# Patient Record
Sex: Female | Born: 1985 | Hispanic: Refuse to answer | Marital: Single
Health system: Southern US, Community
[De-identification: ages and names within clinical notes are randomized; demographics above are authoritative.]

---

## 2020-09-30 ENCOUNTER — Ambulatory Visit: Payer: Self-pay

## 2020-09-30 ENCOUNTER — Other Ambulatory Visit: Payer: Self-pay

## 2020-09-30 ENCOUNTER — Other Ambulatory Visit: Payer: Self-pay | Admitting: Occupational Medicine

## 2020-09-30 DIAGNOSIS — M25532 Pain in left wrist: Secondary | ICD-10-CM

## 2021-07-01 ENCOUNTER — Emergency Department (HOSPITAL_COMMUNITY): Payer: 59

## 2021-07-01 ENCOUNTER — Emergency Department (HOSPITAL_COMMUNITY)
Admission: EM | Admit: 2021-07-01 | Discharge: 2021-07-02 | Disposition: A | Discharge status: Home / Self Care | Payer: 59 | Attending: Emergency Medicine | Admitting: Emergency Medicine

## 2021-07-01 ENCOUNTER — Other Ambulatory Visit: Payer: Self-pay

## 2021-07-01 ENCOUNTER — Encounter (HOSPITAL_COMMUNITY): Payer: Self-pay | Admitting: Emergency Medicine

## 2021-07-01 DIAGNOSIS — Z3A01 Less than 8 weeks gestation of pregnancy: Secondary | ICD-10-CM | POA: Insufficient documentation

## 2021-07-01 DIAGNOSIS — O23591 Infection of other part of genital tract in pregnancy, first trimester: Secondary | ICD-10-CM | POA: Insufficient documentation

## 2021-07-01 DIAGNOSIS — B9689 Other specified bacterial agents as the cause of diseases classified elsewhere: Secondary | ICD-10-CM | POA: Diagnosis not present

## 2021-07-01 DIAGNOSIS — N9489 Other specified conditions associated with female genital organs and menstrual cycle: Secondary | ICD-10-CM | POA: Insufficient documentation

## 2021-07-01 DIAGNOSIS — O23599 Infection of other part of genital tract in pregnancy, unspecified trimester: Secondary | ICD-10-CM

## 2021-07-01 LAB — CBC WITH DIFFERENTIAL/PLATELET
Abs Immature Granulocytes: 0.01 10*3/uL (ref 0.00–0.07)
Basophils Absolute: 0 10*3/uL (ref 0.0–0.1)
Basophils Relative: 0 %
Eosinophils Absolute: 0.1 10*3/uL (ref 0.0–0.5)
Eosinophils Relative: 3 %
HCT: 32.8 % — ABNORMAL LOW (ref 36.0–46.0)
Hemoglobin: 10.3 g/dL — ABNORMAL LOW (ref 12.0–15.0)
Immature Granulocytes: 0 %
Lymphocytes Relative: 43 %
Lymphs Abs: 2 10*3/uL (ref 0.7–4.0)
MCH: 25.6 pg — ABNORMAL LOW (ref 26.0–34.0)
MCHC: 31.4 g/dL (ref 30.0–36.0)
MCV: 81.4 fL (ref 80.0–100.0)
Monocytes Absolute: 0.5 10*3/uL (ref 0.1–1.0)
Monocytes Relative: 10 %
Neutro Abs: 2.1 10*3/uL (ref 1.7–7.7)
Neutrophils Relative %: 44 %
Platelets: 347 10*3/uL (ref 150–400)
RBC: 4.03 MIL/uL (ref 3.87–5.11)
RDW: 16.7 % — ABNORMAL HIGH (ref 11.5–15.5)
WBC: 4.6 10*3/uL (ref 4.0–10.5)
nRBC: 0 % (ref 0.0–0.2)

## 2021-07-01 LAB — URINALYSIS, ROUTINE W REFLEX MICROSCOPIC
Bilirubin Urine: NEGATIVE
Glucose, UA: NEGATIVE mg/dL
Hgb urine dipstick: NEGATIVE
Ketones, ur: NEGATIVE mg/dL
Leukocytes,Ua: NEGATIVE
Nitrite: NEGATIVE
Protein, ur: NEGATIVE mg/dL
Specific Gravity, Urine: 1.011 (ref 1.005–1.030)
pH: 6 (ref 5.0–8.0)

## 2021-07-01 LAB — COMPREHENSIVE METABOLIC PANEL
ALT: 11 U/L (ref 0–44)
AST: 15 U/L (ref 15–41)
Albumin: 3.6 g/dL (ref 3.5–5.0)
Alkaline Phosphatase: 41 U/L (ref 38–126)
Anion gap: 9 (ref 5–15)
BUN: 13 mg/dL (ref 6–20)
CO2: 22 mmol/L (ref 22–32)
Calcium: 8.6 mg/dL — ABNORMAL LOW (ref 8.9–10.3)
Chloride: 106 mmol/L (ref 98–111)
Creatinine, Ser: 0.79 mg/dL (ref 0.44–1.00)
GFR, Estimated: >60 mL/min (ref >60)
Glucose, Bld: 109 mg/dL — ABNORMAL HIGH (ref 70–99)
Potassium: 3.4 mmol/L — ABNORMAL LOW (ref 3.5–5.1)
Sodium: 137 mmol/L (ref 135–145)
Total Bilirubin: 0.2 mg/dL — ABNORMAL LOW (ref 0.3–1.2)
Total Protein: 6.8 g/dL (ref 6.5–8.1)

## 2021-07-01 LAB — WET PREP, GENITAL
Sperm: NONE SEEN
Trich, Wet Prep: NONE SEEN
WBC, Wet Prep HPF POC: NONE SEEN — AB (ref <10)
Yeast Wet Prep HPF POC: NONE SEEN

## 2021-07-01 LAB — HCG, QUANTITATIVE, PREGNANCY: hCG, Beta Chain, Quant, S: 25278 m[IU]/mL — ABNORMAL HIGH (ref <5)

## 2021-07-01 LAB — LIPASE, BLOOD: Lipase: 31 U/L (ref 11–51)

## 2021-07-01 MED ORDER — METRONIDAZOLE 500 MG PO TABS
500.0000 mg | ORAL_TABLET | Freq: Once | ORAL | Status: AC
  Administered 2021-07-01: 500 mg via ORAL
  Filled 2021-07-01: qty 1

## 2021-07-01 MED ORDER — METRONIDAZOLE 500 MG PO TABS: 500.0000 mg | ORAL_TABLET | Freq: Two times a day (BID) | ORAL | 0 refills | Status: AC

## 2021-07-01 NOTE — ED Triage Notes (Signed)
Reports feeling numbness in arms and legs, states that she just recently found of she was pregnant, approximately 6 weeks. Reports a 'slight discharge' and some sharp abdominal pain intermittently. Hx of miscarriage. States she also might have a yeast infection.

## 2021-07-01 NOTE — ED Provider Notes (Signed)
[facility]-EMERGENCY DEPT Provider Note   CSN: [id number] Arrival date & time: 07/01/21  1744     History  Chief Complaint  Patient presents with   Abdominal Pain   Numbness    Jamie Hawkins is a 36 y.o. female who presents to the ED for evaluation of right lower quadrant/pelvic pain that has been ongoing for the past several days.  Patient states that she had a positive pregnancy test today with her last menstrual period starting on 05/23/2021.  She has a history of 2 full-term pregnancies delivered without complications.  However she does have a history of a miscarriage last year.  She states that she has recently been and thinks that she may have a yeast infection.  She endorses some itching but denies vaginal pain or discomfort.  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  She denies vaginal bleeding constipation, chest pain, diarrhea.   Abdominal Pain Associated symptoms: no fever, no shortness of breath and no vomiting       Home Medications Prior to Admission medications   Not on File      Allergies    Patient has no known allergies.    Review of Systems   Review of Systems  Constitutional:  Negative for fever.  HENT: Negative.    Eyes: Negative.   Respiratory:  Negative for shortness of breath.   Cardiovascular: Negative.   Gastrointestinal:  Positive for abdominal pain. Negative for vomiting.  Endocrine: Negative.   Genitourinary: Negative.   Musculoskeletal: Negative.   Skin:  Negative for rash.  Neurological:  Negative for headaches.  All other systems reviewed and are negative.  Physical Exam Updated Vital Signs BP 103/68 (BP Location: Right Arm)    Pulse 74    Temp 98.1 F (36.7 C) (Oral)    Resp 16    Ht 5\' 8"  (1.727 m)    Wt 73.9 kg    LMP 08/31/2020    SpO2 97%    BMI 24.78 kg/m  Physical Exam Vitals and nursing note  reviewed. Exam conducted with a chaperone present.  Constitutional:      General: She is not in acute distress.    Appearance: She is not ill-appearing.  HENT:     Head: Atraumatic.  Eyes:     Conjunctiva/sclera: Conjunctivae normal.  Cardiovascular:     Rate and Rhythm: Normal rate and regular rhythm.     Pulses: Normal pulses.     Heart sounds: No murmur heard. Pulmonary:     Effort: Pulmonary effort is normal. No respiratory distress.     Breath sounds: Normal breath sounds.  Abdominal:     General: Abdomen is flat. There is no distension.     Palpations: Abdomen is soft.     Tenderness: There is abdominal tenderness in the right lower quadrant and suprapubic area. There is no right CVA tenderness or left CVA tenderness. Negative signs include Murphy's sign and McBurney's sign.     Comments: Abdomen soft, nondistended tenderness to palpation of the right suprapubic/RLQ.   Genitourinary:    Cervix: Discharge present. No cervical motion tenderness.     Comments: Negative cervical motion tenderness.  Vaginal canal and cervix without erythema or lesions.  There was some frothy white discharge. Musculoskeletal:        General: Normal range of motion.     Cervical back: Normal range of  motion.  Skin:    General: Skin is warm and dry.     Capillary Refill: Capillary refill takes less than 2 seconds.  Neurological:     General: No focal deficit present.     Mental Status: She is alert.     Comments: Speech is clear, able to follow commands  Strong and equal grip strength Sensation normal to light and sharp touch in UE and LE bilaterally Moves extremities without ataxia, coordination intact Normal finger to nose and rapid alternating movements No pronator drift    Psychiatric:        Mood and Affect: Mood normal.    ED Results / Procedures / Treatments   Labs (all labs ordered are listed, but only abnormal results are displayed) Labs Reviewed  CBC WITH DIFFERENTIAL/PLATELET  - Abnormal; Notable for the following components:      Result Value   Hemoglobin 10.3 (*)    HCT 32.8 (*)    MCH 25.6 (*)    RDW 16.7 (*)    All other components within normal limits  WET PREP, GENITAL  HCG, QUANTITATIVE, PREGNANCY  COMPREHENSIVE METABOLIC PANEL  URINALYSIS, ROUTINE W REFLEX MICROSCOPIC  LIPASE, BLOOD  GC/CHLAMYDIA PROBE AMP ([place]) NOT AT [facility]    EKG None  Radiology No results found.  Procedures Procedures    Medications Ordered in ED Medications - No data to display  ED Course/ Medical Decision Making/ A&P                           Medical Decision Making Amount and/or Complexity of Data Reviewed Labs: ordered. Radiology: ordered.  Risk Prescription drug management.   History:  Per HPI  Initial impression:  This patient presents to the ED for concern of lower abdominal pain in the setting of pregnancy, this involves an extensive number of treatment options, and is a complaint that carries with it a high risk of complications and morbidity.   Differentials include vaginal infection, miscarriage, PID, constipation, gallbladder pathology  ED Course: Patient is well-appearing, no acute distress, nontoxic-appearing.  Reports positive pregnancy test at home and suspects she has a yeast infection.  Abdominal exam significant for right lower quadrant and right pelvic pain tenderness to palpation.  Abdomen is otherwise soft, nondistended.  Neuro exam was unremarkable.  Pelvic exam without cervical motion tenderness.  There was some frothy white discharge.  Os was closed without evidence of miscarriage or fetal product. Wet prep was positive for bacterial vaginosis.  hCG quant at over 25,000.  CMP and CBC without any abnormalities.  UA without infection.  OB ultrasound identifies an intrauterine pregnancy at approximately 6 weeks and 2 days.  He does have a slow heart rate, but this is likely due to its early gestation.  Overall patient's work-up is  reassuring.  No evidence of ovarian torsion, or ectopic pregnancy.  She is not currently having any vaginal bleeding.  No urinary tract infection.  She can be discharged home with strict return precautions and outpatient follow-up  Lab Tests and EKG:  I Ordered, reviewed, and interpreted labs, including CBC, UA, wet prep, GC chlamydia, pregnancy test, lipase and CMP and EKG.  The pertinent results are in the ED course above   Imaging Studies ordered:  I ordered imaging studies including an OB ultrasound for less than 14 weeks I independently visualized and interpreted imaging and I agree with the radiologist interpretation.    Cardiac Monitoring:  The  patient was maintained on a cardiac monitor.  I personally viewed and interpreted the cardiac monitored which showed an underlying rhythm of: NSR   Medicines ordered and prescription drug management:  I ordered medication including: Metronidazole 500 mg for BV Reevaluation of the patient after these medicines showed that the patient stayed the same I have reviewed the patients home medicines and have made adjustments as needed  Disposition:  After consideration of the diagnostic results, physical exam, history and the patients response to treatment feel that the patent would benefit from discharge with strict return precautions.   Bacterial vaginosis in pregnancy: Emergent concerns for patient's chief complaint have been ruled out, and the only abnormality we can find a physical exam and work-up was for bacterial vaginosis.  Up-to-date confirms that metronidazole p.o. is safe to given first trimester pregnancy.  I also called pharmacy and spoke with pharmacist Fayrene Fearing who also confirms that it is safe to give during first trimester pregnancy.  Her first dose was given here in the ED and she is able to fill the remaining prescription tomorrow.  Have also given a referral to an OB/GYN if she does not have previously established care.  Return  precautions were discussed.  All questions asked and answered.  She was discharged home in good condition.   Final Clinical Impression(s) / ED Diagnoses Final diagnoses:  None    Rx / DC Orders ED Discharge Orders     None         Delight Ovens 07/01/21 2350    Linwood Dibbles, MD 07/04/21 [phone]

## 2021-07-01 NOTE — Discharge Instructions (Addendum)
Your work-up today is overall reassuring.  Your ultrasound dates the fetus at approximately 6 weeks and 41 days old.  They did note that the heart rate was slightly low, however it is so early on in its gestation that this is the likely cause.  Recommend that you follow-up with your OB/GYN for repeat ultrasound in 14 days to ensure proper progression.  You also were found to have bacterial vaginosis.  Given your first dose of metronidazole here in the emergency department, and you can fill the remaining prescription tomorrow to take twice daily for 7 days.  Return to the ED if you have worsening pain, vaginal bleeding.  Otherwise follow-up with your OB/GYN.

## 2021-07-02 LAB — GC/CHLAMYDIA PROBE AMP (~~LOC~~) NOT AT ARMC
Chlamydia: NEGATIVE
Comment: NEGATIVE
Comment: NORMAL
Neisseria Gonorrhea: NEGATIVE

## 2021-07-24 ENCOUNTER — Emergency Department (HOSPITAL_COMMUNITY)
Admission: EM | Admit: 2021-07-24 | Discharge: 2021-07-24 | Disposition: A | Discharge status: Home / Self Care | Payer: 59 | Attending: Emergency Medicine | Admitting: Emergency Medicine

## 2021-07-24 ENCOUNTER — Encounter (HOSPITAL_COMMUNITY): Payer: Self-pay

## 2021-07-24 ENCOUNTER — Emergency Department (HOSPITAL_COMMUNITY): Payer: 59

## 2021-07-24 DIAGNOSIS — Z3A01 Less than 8 weeks gestation of pregnancy: Secondary | ICD-10-CM | POA: Insufficient documentation

## 2021-07-24 DIAGNOSIS — R42 Dizziness and giddiness: Secondary | ICD-10-CM | POA: Insufficient documentation

## 2021-07-24 DIAGNOSIS — O208 Other hemorrhage in early pregnancy: Secondary | ICD-10-CM | POA: Insufficient documentation

## 2021-07-24 DIAGNOSIS — O26891 Other specified pregnancy related conditions, first trimester: Secondary | ICD-10-CM | POA: Diagnosis not present

## 2021-07-24 DIAGNOSIS — R109 Unspecified abdominal pain: Secondary | ICD-10-CM | POA: Insufficient documentation

## 2021-07-24 DIAGNOSIS — N939 Abnormal uterine and vaginal bleeding, unspecified: Secondary | ICD-10-CM

## 2021-07-24 DIAGNOSIS — N9489 Other specified conditions associated with female genital organs and menstrual cycle: Secondary | ICD-10-CM | POA: Diagnosis not present

## 2021-07-24 DIAGNOSIS — O034 Incomplete spontaneous abortion without complication: Secondary | ICD-10-CM

## 2021-07-24 LAB — COMPREHENSIVE METABOLIC PANEL
ALT: 10 U/L (ref 0–44)
AST: 14 U/L — ABNORMAL LOW (ref 15–41)
Albumin: 3.8 g/dL (ref 3.5–5.0)
Alkaline Phosphatase: 41 U/L (ref 38–126)
Anion gap: 7 (ref 5–15)
BUN: 16 mg/dL (ref 6–20)
CO2: 24 mmol/L (ref 22–32)
Calcium: 8.3 mg/dL — ABNORMAL LOW (ref 8.9–10.3)
Chloride: 106 mmol/L (ref 98–111)
Creatinine, Ser: 0.87 mg/dL (ref 0.44–1.00)
GFR, Estimated: >60 mL/min (ref >60)
Glucose, Bld: 98 mg/dL (ref 70–99)
Potassium: 3.9 mmol/L (ref 3.5–5.1)
Sodium: 137 mmol/L (ref 135–145)
Total Bilirubin: 0.2 mg/dL — ABNORMAL LOW (ref 0.3–1.2)
Total Protein: 7.1 g/dL (ref 6.5–8.1)

## 2021-07-24 LAB — CBC WITH DIFFERENTIAL/PLATELET
Abs Immature Granulocytes: 0.01 10*3/uL (ref 0.00–0.07)
Basophils Absolute: 0 10*3/uL (ref 0.0–0.1)
Basophils Relative: 0 %
Eosinophils Absolute: 0.1 10*3/uL (ref 0.0–0.5)
Eosinophils Relative: 2 %
HCT: 31.8 % — ABNORMAL LOW (ref 36.0–46.0)
Hemoglobin: 9.7 g/dL — ABNORMAL LOW (ref 12.0–15.0)
Immature Granulocytes: 0 %
Lymphocytes Relative: 42 %
Lymphs Abs: 2 10*3/uL (ref 0.7–4.0)
MCH: 25 pg — ABNORMAL LOW (ref 26.0–34.0)
MCHC: 30.5 g/dL (ref 30.0–36.0)
MCV: 82 fL (ref 80.0–100.0)
Monocytes Absolute: 0.4 10*3/uL (ref 0.1–1.0)
Monocytes Relative: 8 %
Neutro Abs: 2.3 10*3/uL (ref 1.7–7.7)
Neutrophils Relative %: 48 %
Platelets: 406 10*3/uL — ABNORMAL HIGH (ref 150–400)
RBC: 3.88 MIL/uL (ref 3.87–5.11)
RDW: 15.9 % — ABNORMAL HIGH (ref 11.5–15.5)
WBC: 4.8 10*3/uL (ref 4.0–10.5)
nRBC: 0 % (ref 0.0–0.2)

## 2021-07-24 LAB — HCG, QUANTITATIVE, PREGNANCY: hCG, Beta Chain, Quant, S: 550 m[IU]/mL — ABNORMAL HIGH (ref <5)

## 2021-07-24 LAB — ABO/RH: ABO/RH(D): A POS

## 2021-07-24 NOTE — ED Notes (Signed)
Pt in US at this time 

## 2021-07-24 NOTE — ED Provider Notes (Signed)
?[facility]-EMERGENCY DEPT ?Provider Note ? ? ?CSN: [id number] ?Arrival date & time: 07/24/21  1033 ? ?  ? ?History ? ?Chief Complaint  ?Patient presents with  ? Vaginal Bleeding  ? ? ?Jamie Hawkins is a 36 y.o. female. ? ?36 year old female, 2 months pregnant, LMP 05/23/21, 2 weeks of heavy bleeding and cramping. Bleeding had tapered off but came back worse today which prompted ER visit today. Complains of feeling dizzy, concern for complications of miscarriage.  ?2 prior c-sections (2006, 2012), miscarriage 2022.  Reports using 1 pad prior to arrival in the ER today. ? ? ?  ? ?Home Medications ?Prior to Admission medications   ?Not on File  ?   ? ?Allergies    ?Patient has no known allergies.   ? ?Review of Systems   ?Review of Systems ?Negative except as per HPI ?Physical Exam ?Updated Vital Signs ?BP 104/73   Pulse 65   Resp 10   LMP 08/31/2020   SpO2 97%  ?Physical Exam ?Vitals and nursing note reviewed. Exam conducted with a chaperone present.  ?Constitutional:   ?   General: She is not in acute distress. ?   Appearance: She is well-developed. She is not diaphoretic.  ?HENT:  ?   Head: Normocephalic and atraumatic.  ?Cardiovascular:  ?   Rate and Rhythm: Normal rate and regular rhythm.  ?   Heart sounds: Normal heart sounds.  ?Pulmonary:  ?   Effort: Pulmonary effort is normal.  ?   Breath sounds: Normal breath sounds.  ?Abdominal:  ?   Palpations: Abdomen is soft.  ?   Tenderness: There is no abdominal tenderness.  ?Genitourinary: ?   Comments: Clot in the vaginal vault, blood cleared with swabs, no further bleeding noted ?Skin: ?   General: Skin is warm and dry.  ?   Findings: No erythema or rash.  ?Neurological:  ?   Mental Status: She is alert and oriented to person, place, and time.  ?   Motor: No weakness.  ?Psychiatric:     ?   Behavior: Behavior normal.  ? ? ?ED Results / Procedures / Treatments   ?Labs ?(all labs ordered are listed, but only abnormal results are displayed) ?Labs  Reviewed  ?COMPREHENSIVE METABOLIC PANEL - Abnormal; Notable for the following components:  ?    Result Value  ? Calcium 8.3 (*)   ? AST 14 (*)   ? Total Bilirubin 0.2 (*)   ? All other components within normal limits  ?CBC WITH DIFFERENTIAL/PLATELET - Abnormal; Notable for the following components:  ? Hemoglobin 9.7 (*)   ? HCT 31.8 (*)   ? MCH 25.0 (*)   ? RDW 15.9 (*)   ? Platelets 406 (*)   ? All other components within normal limits  ?HCG, QUANTITATIVE, PREGNANCY - Abnormal; Notable for the following components:  ? hCG, Beta Chain, Quant, S 550 (*)   ? All other components within normal limits  ?ABO/RH  ? ? ?EKG ?None ? ?Radiology ?US OB Transvaginal ? ?Result Date: 07/24/2021 ?CLINICAL DATA:  Vaginal bleeding EXAM: OBSTETRIC <14 WK ULTRASOUND TECHNIQUE: Transabdominal ultrasound was performed for evaluation of the gestation as well as the maternal uterus and adnexal regions. COMPARISON:  None. FINDINGS: Intrauterine gestational sac: None Yolk sac:  Not seen Embryo:  Not seen Cardiac Activity: Not seen Heart Rate: Not Seen bpm Subchorionic hemorrhage:  None visualized. Maternal uterus/adnexae: Endometrial stripe is prominent measuring 1.6 cm. There is inhomogeneous echogenicity in the  endometrial cavity in the uterus and cervix. There is no abnormal increased vascularity in the endometrium. There are no dominant adnexal masses. There is vascular flow in both adnexal regions. There is no free fluid in the pelvis. IMPRESSION: There is no demonstrable intrauterine gestational sac. Endometrial stripe is prominent with inhomogeneous echogenicity suggesting possible blood products. There is no abnormal increased vascularity in the endometrium. Electronically Signed   By: Ernie Avena M.D.   On: 07/24/2021 12:28   ? ?Procedures ?Procedures  ? ? ?Medications Ordered in ED ?Medications - No data to display ? ?ED Course/ Medical Decision Making/ A&P ?  ?                        ?Medical Decision Making ? ?36 year old  female presents with concern for miscarriage.  Review of records, patient was last seen emergency room on 07/01/2021, ultrasound showing 6-week 2-day gestation with fetal bradycardia.  Patient states that she started bleeding 2 weeks ago, bleeding was heavy initially and then tapered off.  Bleeding resumed today, heavy in nature, with clots, had bled through 1 pad which prompted her to come to the emergency room. ?Patient evaluated in the emergency room, her vitals are stable.  Her hemoglobin is without significant change from prior, previously 10.3, today 9.7.  Recommend prenatal vitamin with iron. ?Case discussed with Sam, [facility] APP, [facility], given address and information for patient to contact clinic.  Also discussed follow-up with the emergency room for worsening or concerning symptoms or present to the women's entrance at [facility]. ?Ultrasound today with thickened endometrial stripe otherwise no evidence of fetal sac present at this time. ?Patient is a positive, quant today is 550, significantly decreased from prior of 25,278. ? ? ? ? ? ? ? ?Final Clinical Impression(s) / ED Diagnoses ?Final diagnoses:  ?Incomplete miscarriage  ? ? ?Rx / DC Orders ?ED Discharge Orders   ? ? None  ? ?  ? ? ?  ?Jeannie Fend, PA-C ?07/24/21 1451 ? ?  ?Lorre Nick, MD ?07/25/21 1234 ? ?

## 2021-07-24 NOTE — Discharge Instructions (Addendum)
Follow up with GYN listed below: ?[facility]  ?[address]  ?[phone]  ? ?Recommend prenatal/multivitamin with iron.  ? ?Return to the ER or you can go to the [facility] entrance (labor and delivery) at [facility] for any worsening or concerning symptoms.  ?

## 2021-07-24 NOTE — ED Triage Notes (Signed)
Pt arrived via POV, c/o heavy vaginal bleeding x3 weeks. States she was approx 2 months.  ?

## 2022-05-28 IMAGING — DX DG WRIST COMPLETE 3+V*L*
4 series · 4 of 4 positions shown · non-contrast
Comparison: None.

CLINICAL DATA: Pain following trauma

EXAM:
LEFT WRIST - COMPLETE 3+ VIEW

[wrist pa]
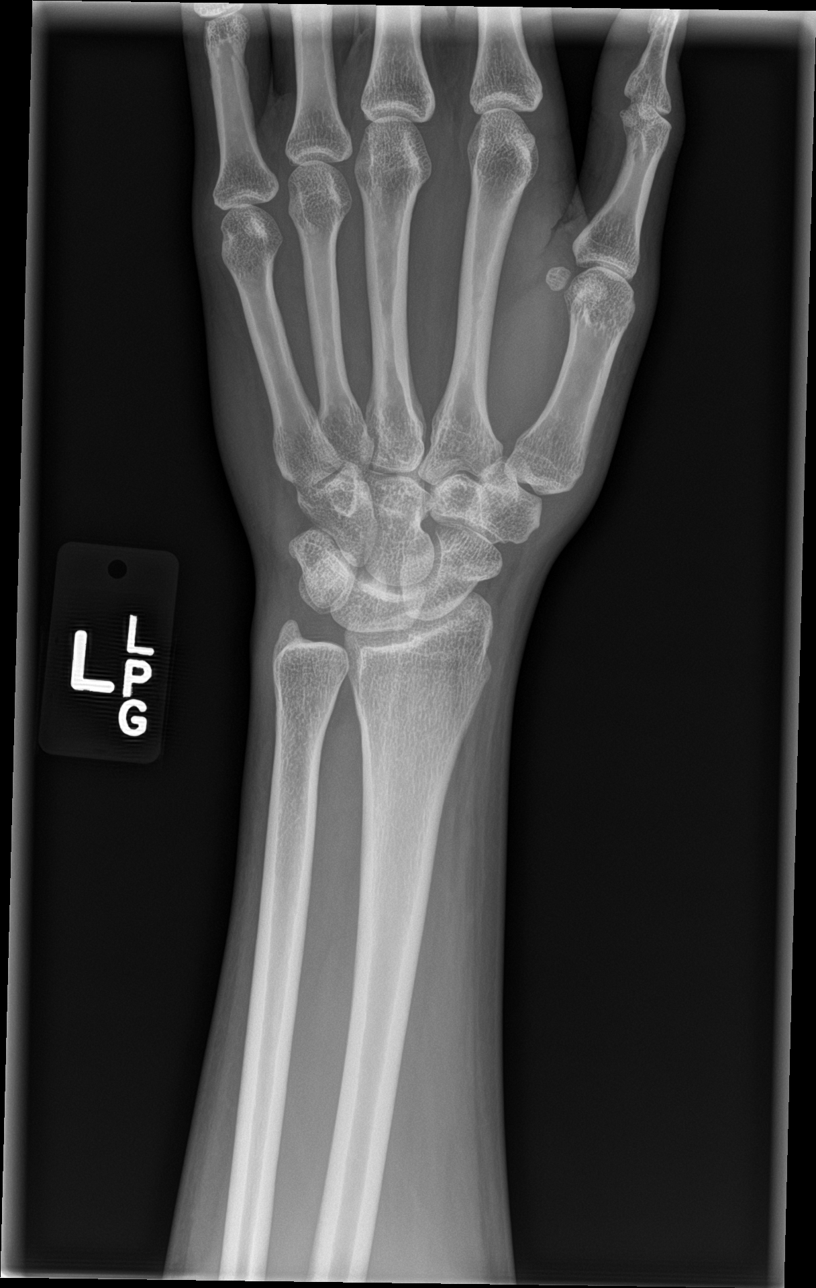

[wrist obl]
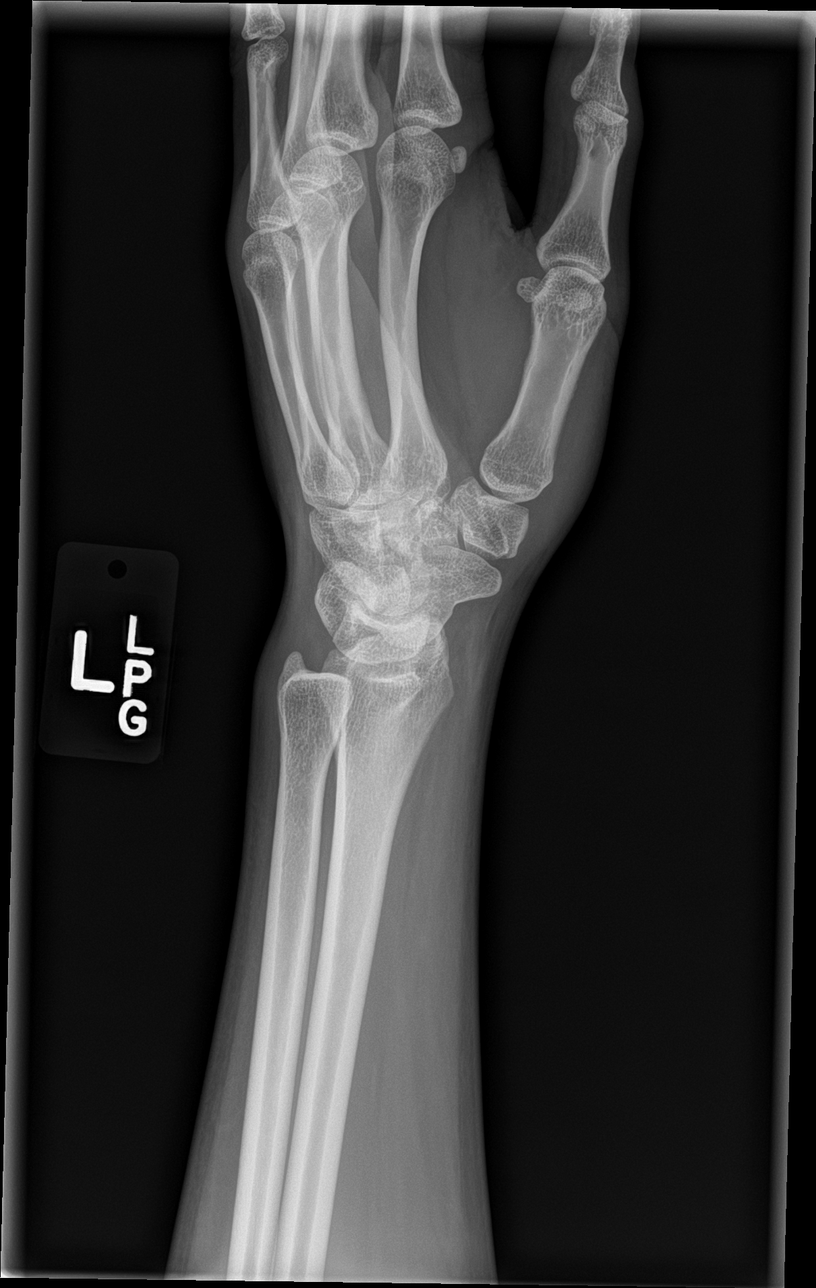

[wrist lat]
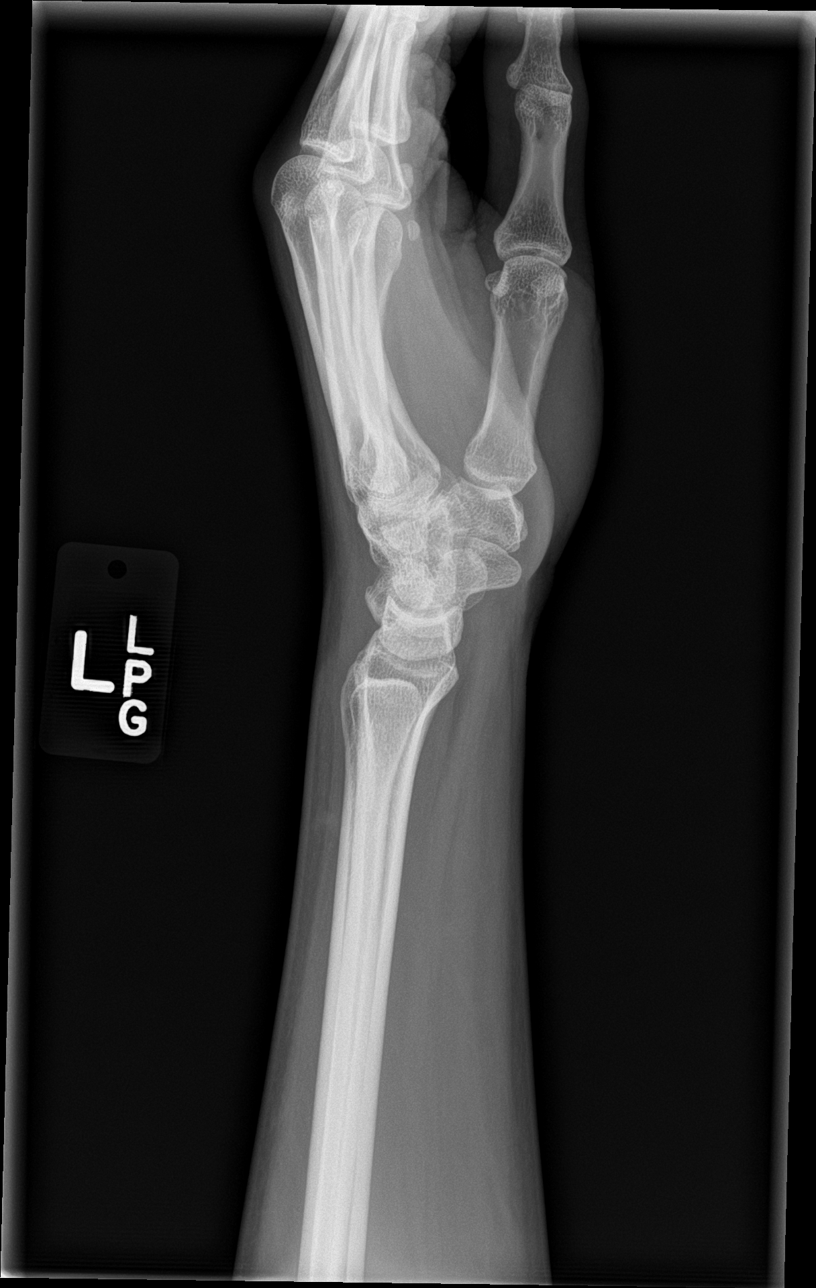

[wrist navicular]
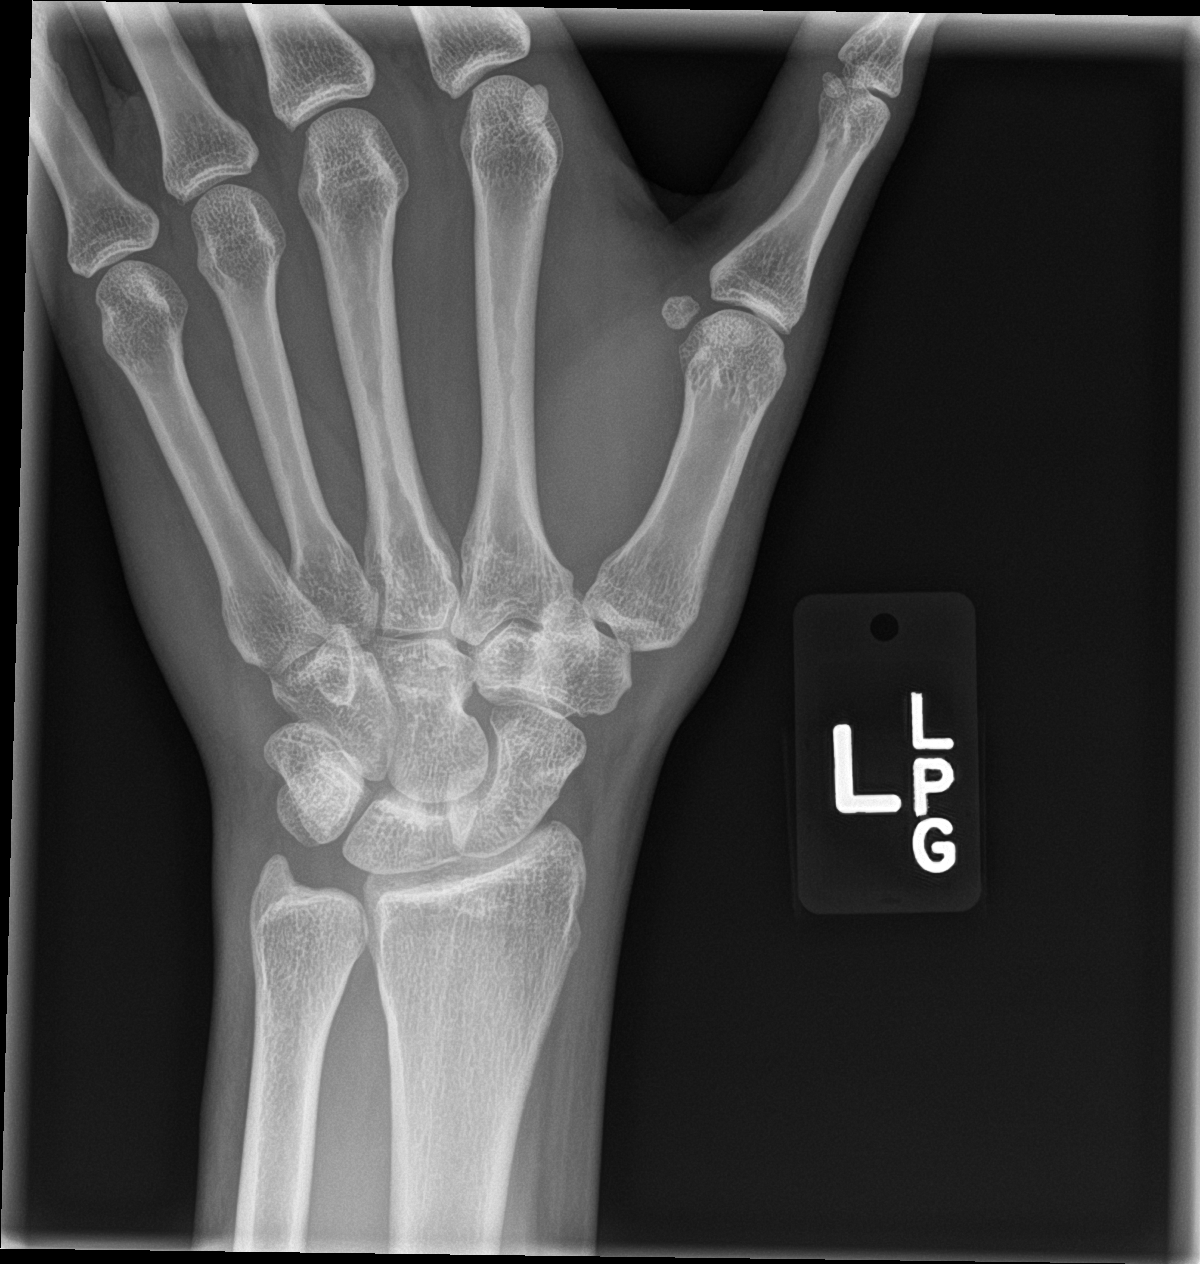

[4 of 4 positions shown; findings below may reference images not displayed]

FINDINGS: Frontal, oblique, lateral, and ulnar deviation scaphoid images were
obtained. No fracture or dislocation. Joint spaces appear normal. No
erosive change.
IMPRESSION: No fracture or dislocation.  No evident arthropathy.

## 2023-02-26 IMAGING — US US OB TRANSVAGINAL
1 series · 15 of 28 positions shown · non-contrast
Comparison: None.

CLINICAL DATA: Right pelvic pain

EXAM:
OBSTETRIC <14 WK US AND TRANSVAGINAL OB US
TECHNIQUE: Both transabdominal and transvaginal ultrasound examinations were
performed for complete evaluation of the gestation as well as the
maternal uterus, adnexal regions, and pelvic cul-de-sac.
Transvaginal technique was performed to assess early pregnancy.

[Series 1: us ob comp less 14 wks mc & wl · 15 of 96 slices shown]
[im 1/96]
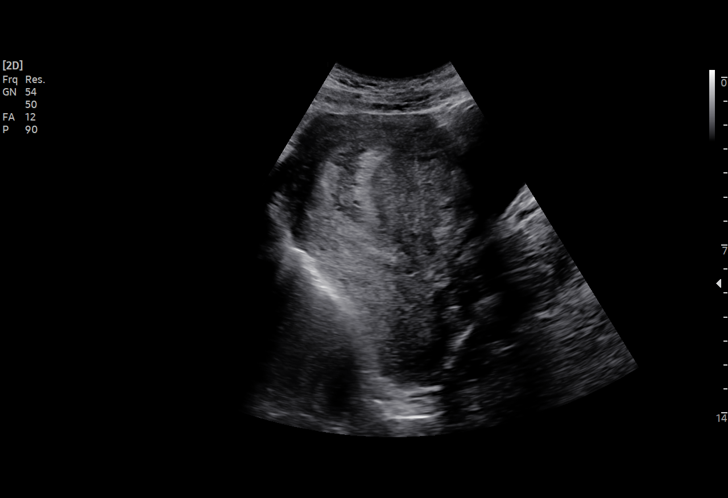
[im 8/96]
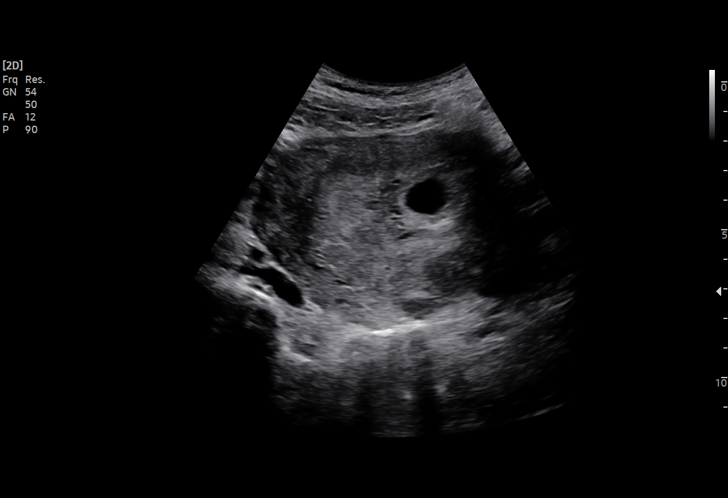
[im 15/96]
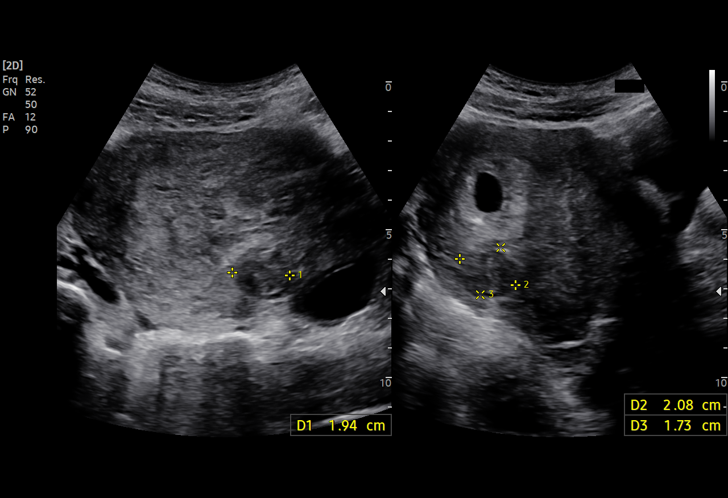
[im 22/96]
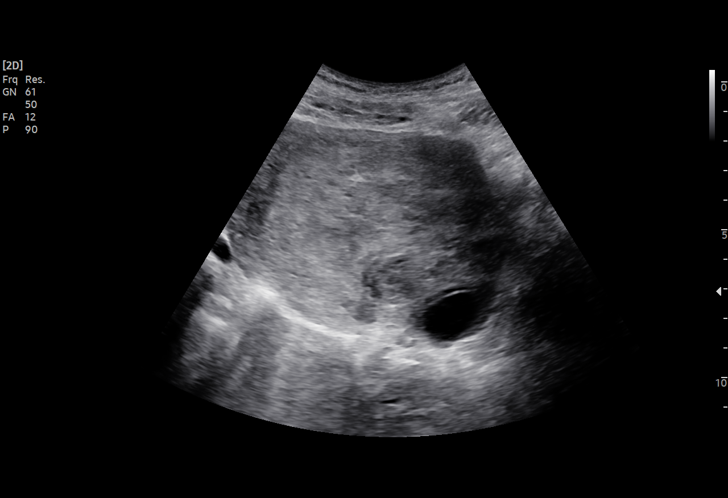
[im 29/96]
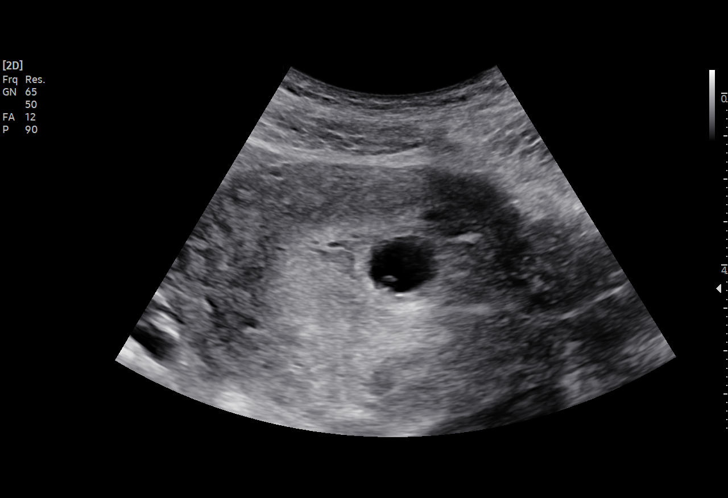
[im 36/96]
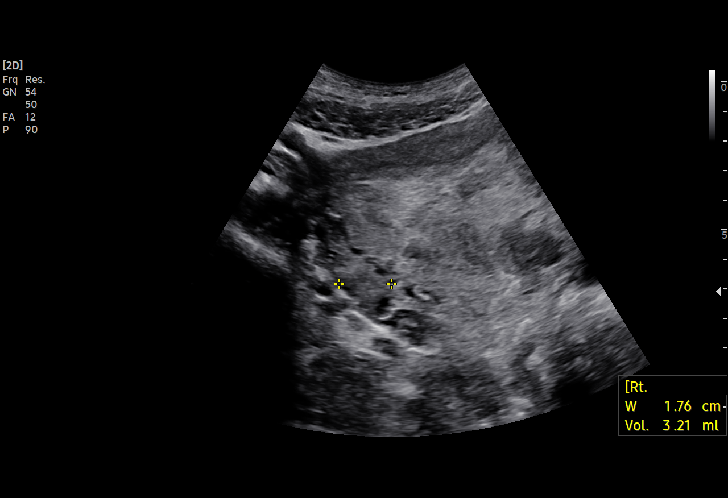
[im 43/96]
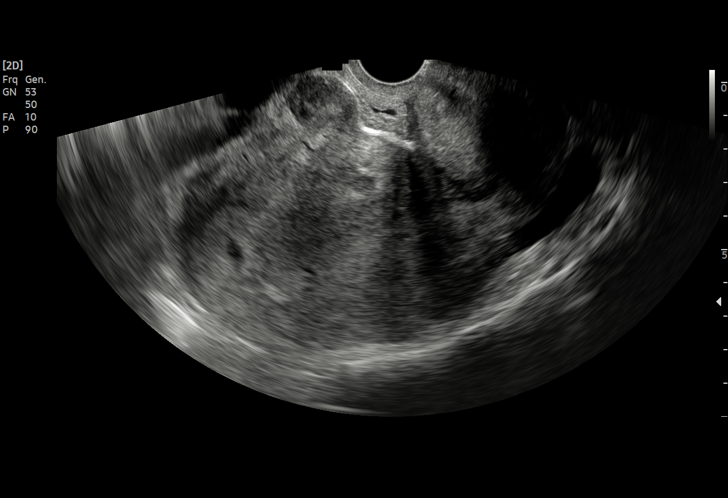
[im 50/96]
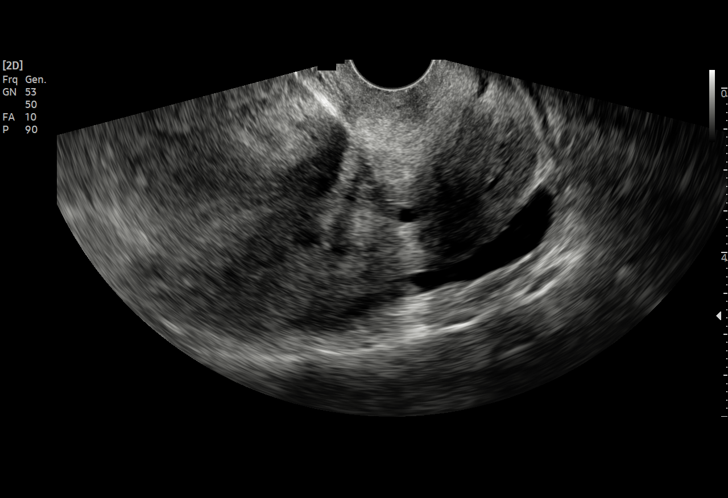
[im 53/96]
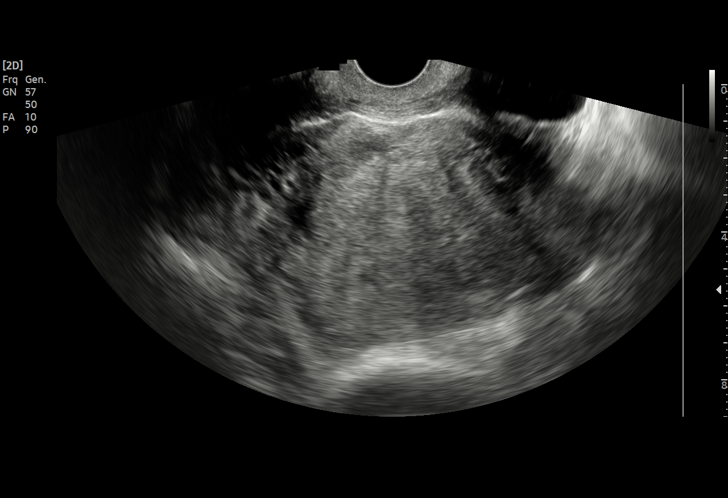
[im 60/96]
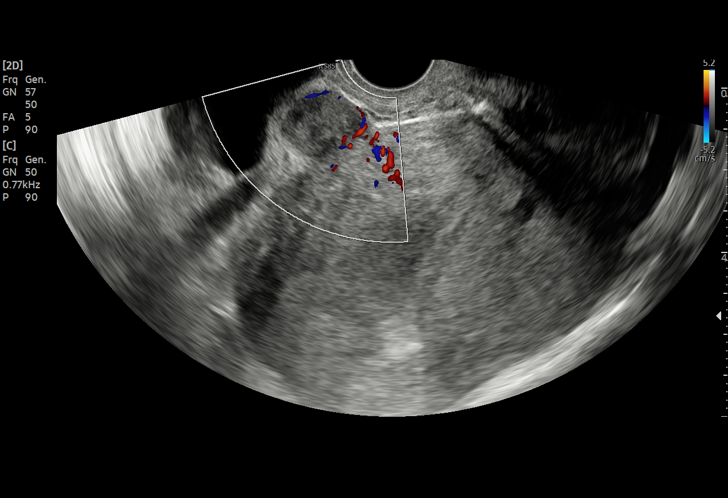
[im 67/96]
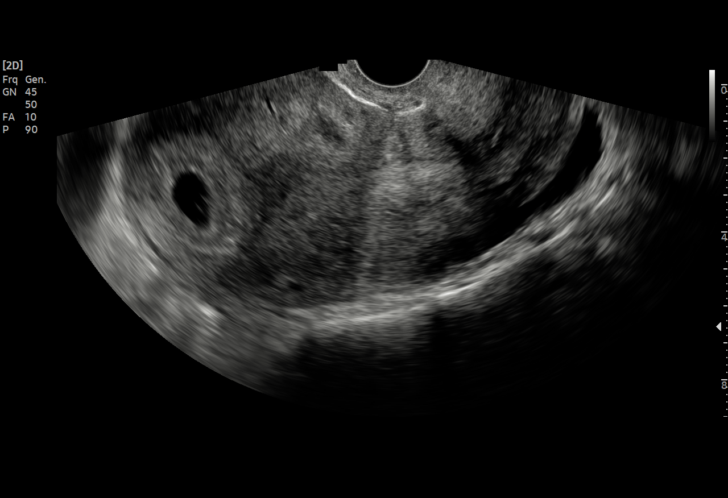
[im 74/96]
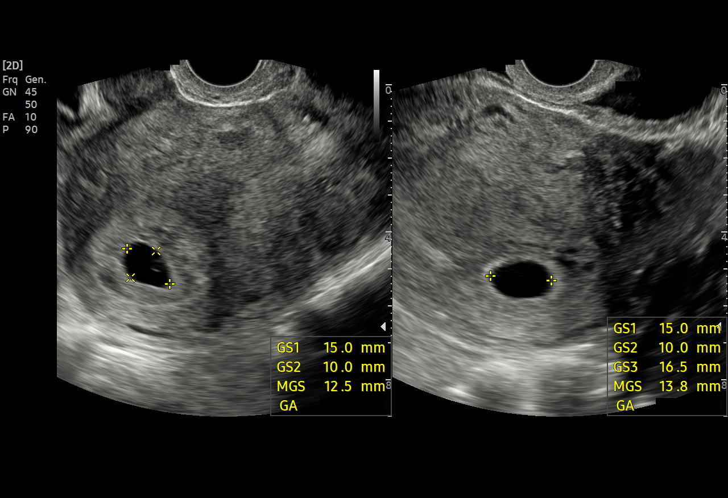
[im 81/96]
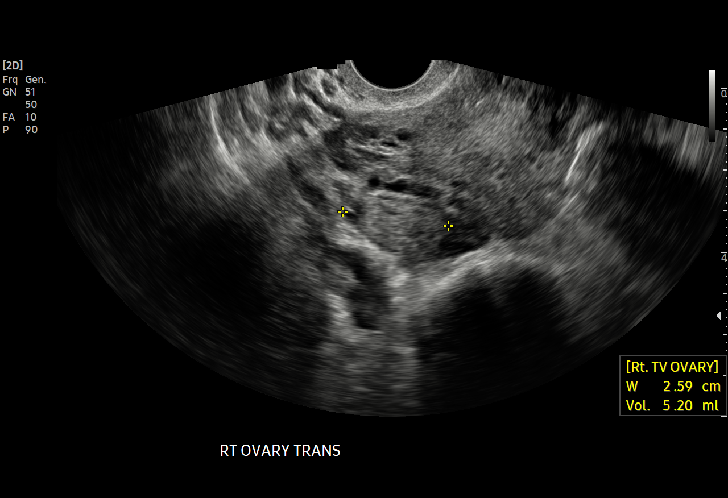
[im 88/96]
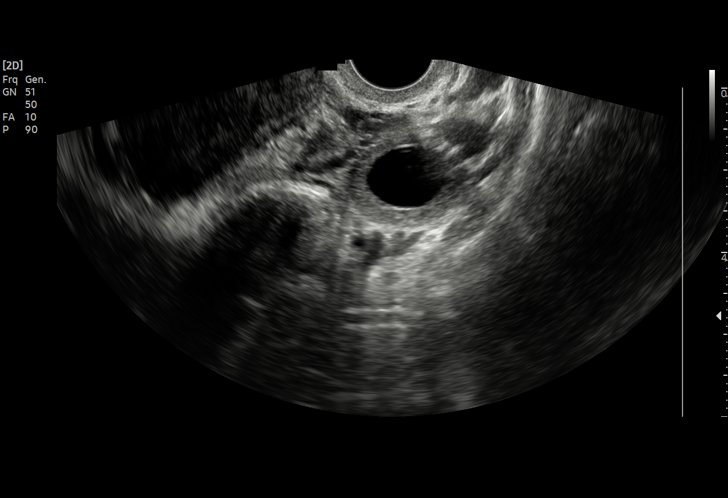
[im 96/96]
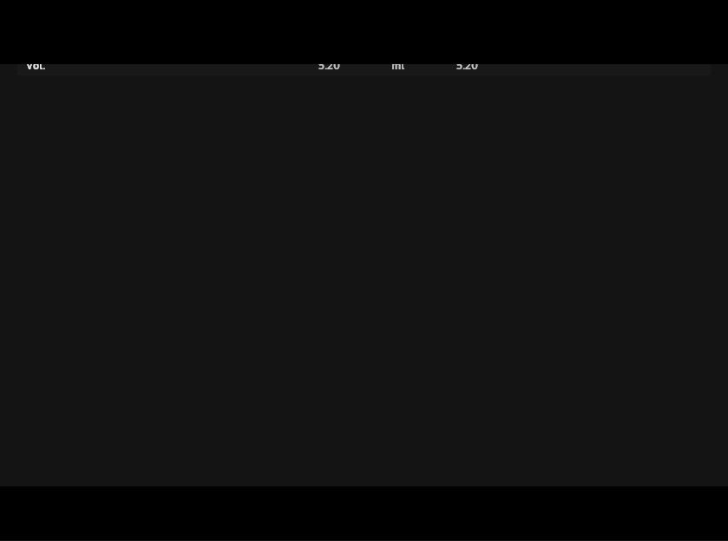

[15 of 28 positions shown; findings below may reference images not displayed]

FINDINGS: Intrauterine gestational sac: Single

Yolk sac:  Visualized.

Embryo:  Visualized.

Cardiac Activity: Visualized.

Heart Rate: 88 bpm

MSD: 13.8 mm   6 w   2 d

CRL:  1.6 mm   too early to date

Subchorionic hemorrhage:  Small subchorionic hemorrhage.

Maternal uterus/adnexae: Multiple uterine fibroids. No adnexal mass.
Trace free fluid.
IMPRESSION: Early intrauterine pregnancy, 6 weeks 2 days by mean sac diameter.
Early fetal pole is visualized, too early to date. Fetal bradycardia
of 88 beats per minute, likely related to early gestational age.
This could be followed with repeat ultrasound in 14 days to ensure
expected progression.

Small subchorionic hemorrhage.

## 2023-02-26 IMAGING — US US OB COMP LESS 14 WK
1 series · 15 of 28 positions shown · non-contrast
Comparison: None.

CLINICAL DATA: Right pelvic pain

EXAM:
OBSTETRIC <14 WK US AND TRANSVAGINAL OB US
TECHNIQUE: Both transabdominal and transvaginal ultrasound examinations were
performed for complete evaluation of the gestation as well as the
maternal uterus, adnexal regions, and pelvic cul-de-sac.
Transvaginal technique was performed to assess early pregnancy.

[Series 1: us ob comp less 14 wks mc & wl · 15 of 96 slices shown]
[im 1/96]
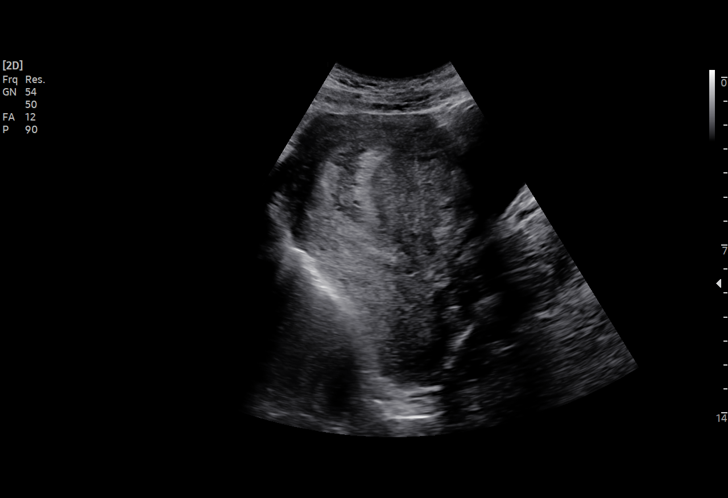
[im 8/96]
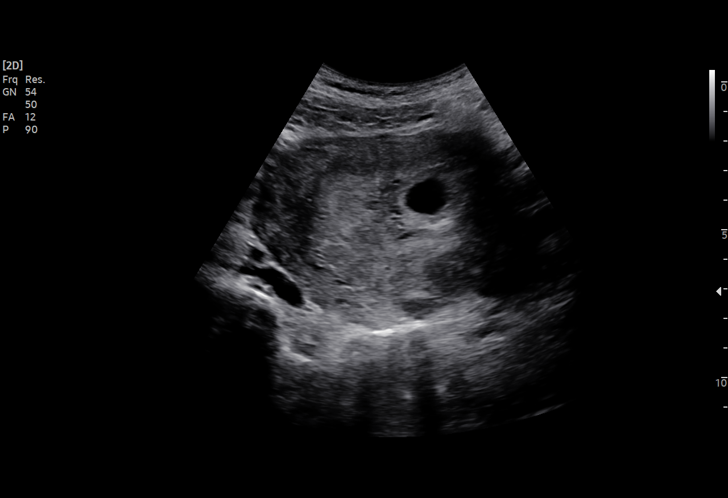
[im 15/96]
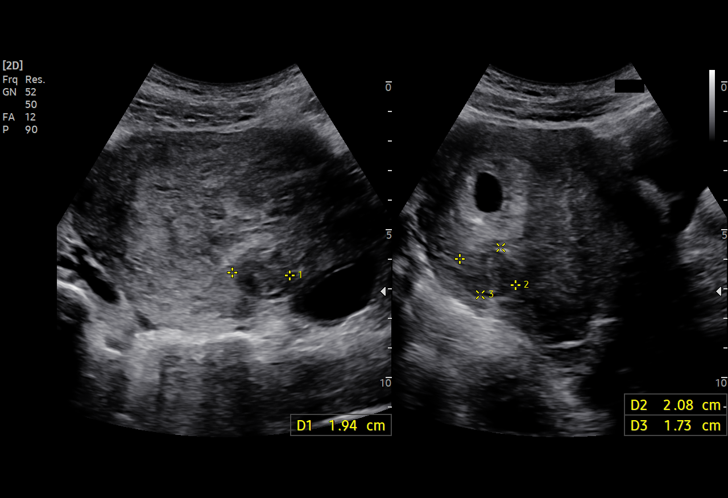
[im 22/96]
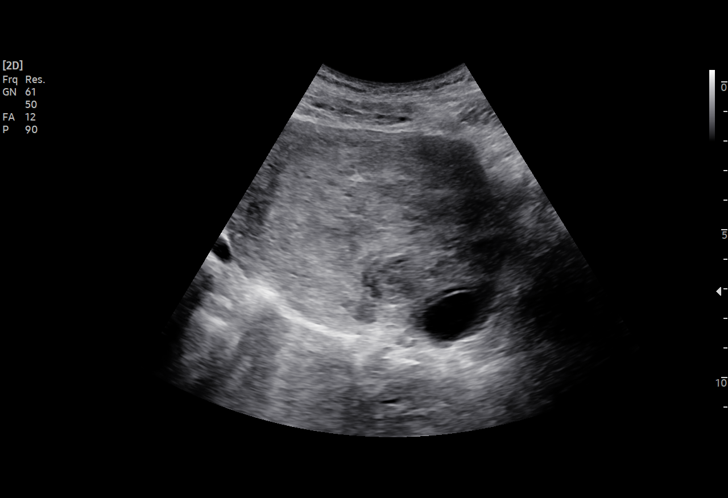
[im 29/96]
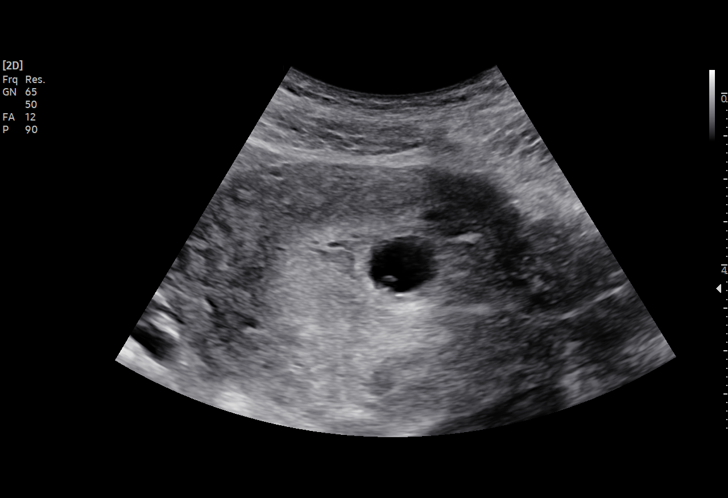
[im 36/96]
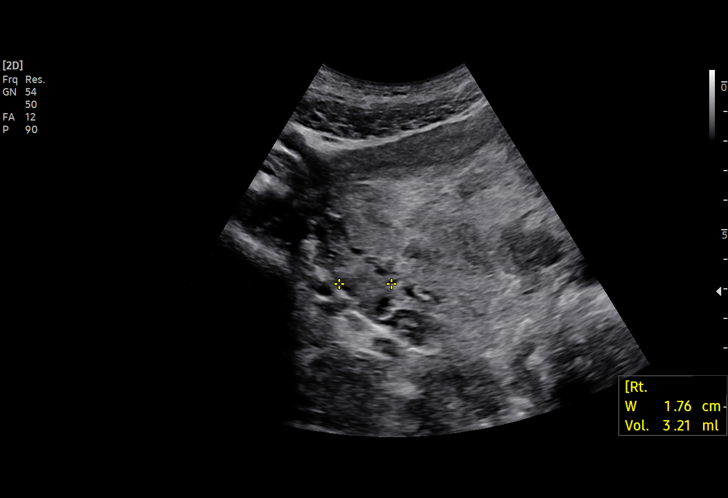
[im 43/96]
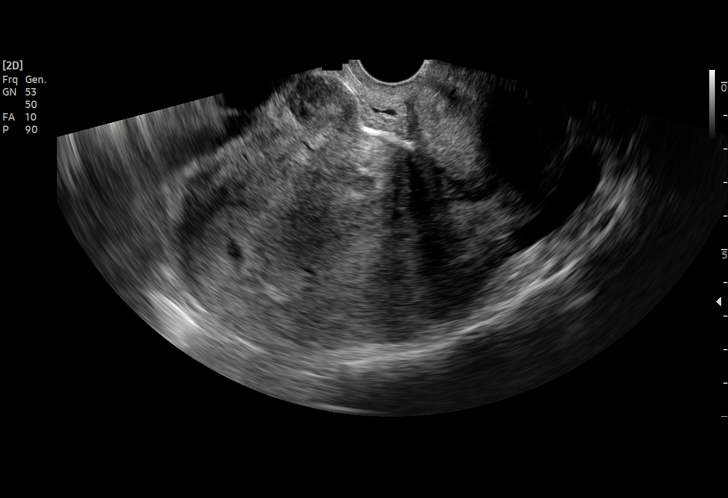
[im 50/96]
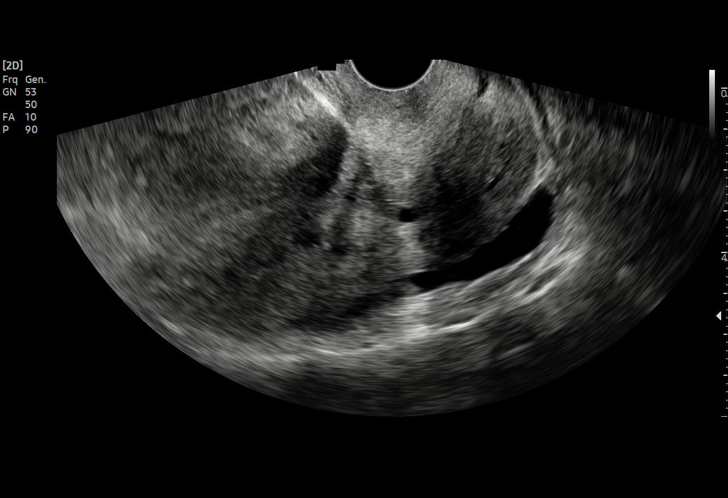
[im 53/96]
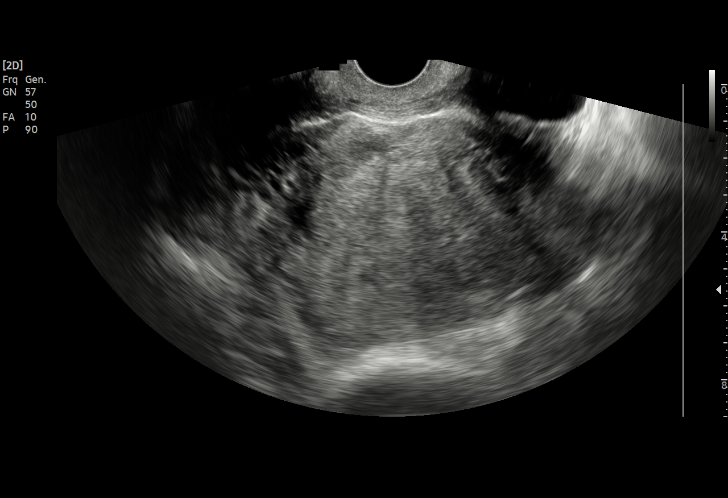
[im 60/96]
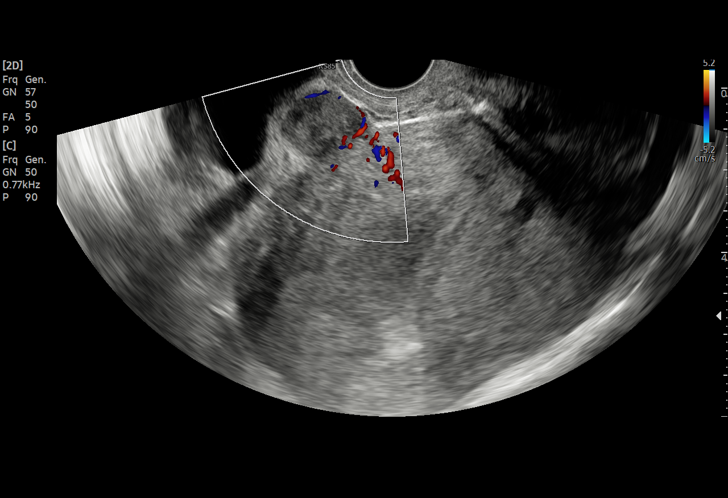
[im 67/96]
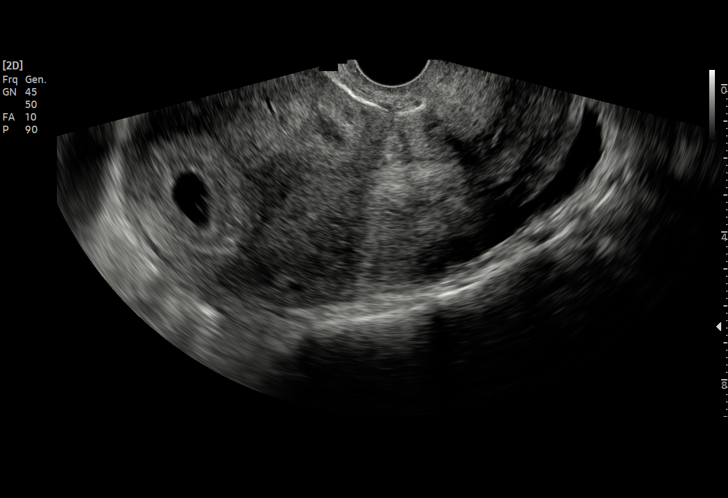
[im 74/96]
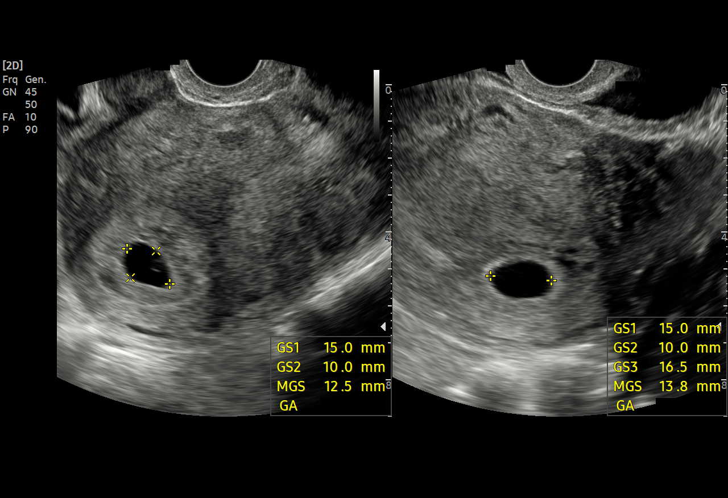
[im 81/96]
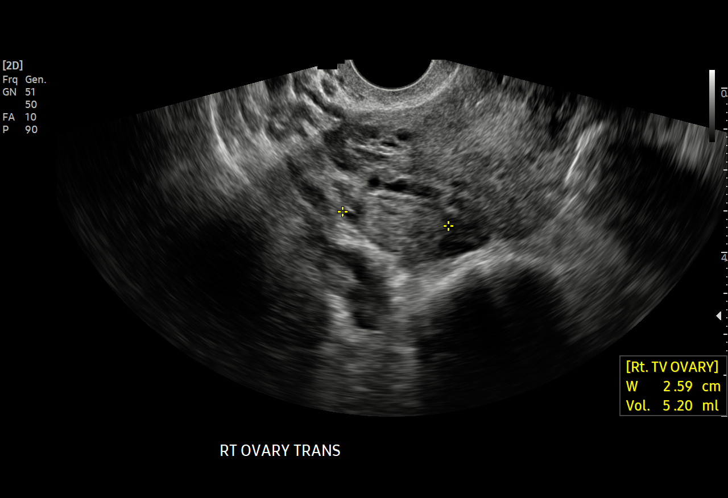
[im 88/96]
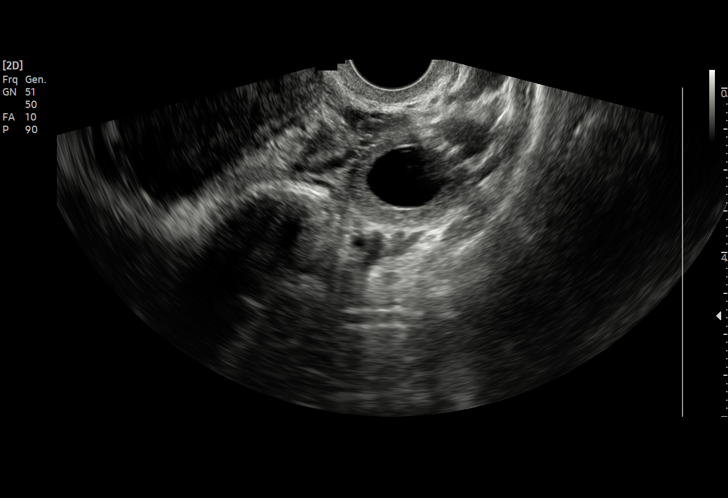
[im 96/96]
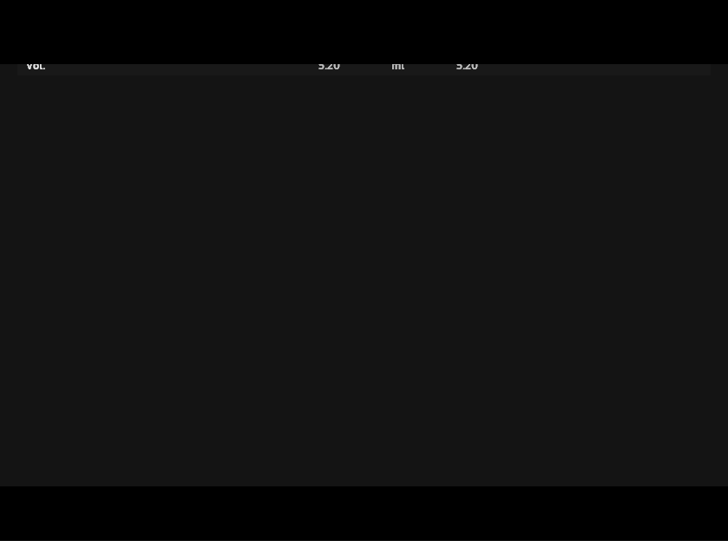

[15 of 28 positions shown; findings below may reference images not displayed]

FINDINGS: Intrauterine gestational sac: Single

Yolk sac:  Visualized.

Embryo:  Visualized.

Cardiac Activity: Visualized.

Heart Rate: 88 bpm

MSD: 13.8 mm   6 w   2 d

CRL:  1.6 mm   too early to date

Subchorionic hemorrhage:  Small subchorionic hemorrhage.

Maternal uterus/adnexae: Multiple uterine fibroids. No adnexal mass.
Trace free fluid.
IMPRESSION: Early intrauterine pregnancy, 6 weeks 2 days by mean sac diameter.
Early fetal pole is visualized, too early to date. Fetal bradycardia
of 88 beats per minute, likely related to early gestational age.
This could be followed with repeat ultrasound in 14 days to ensure
expected progression.

Small subchorionic hemorrhage.
